# Patient Record
Sex: Male | Born: 1995 | Race: White | Hispanic: No | Marital: Single | Smoking: Never smoker
Health system: Southern US, Community
[De-identification: ages and names within clinical notes are randomized; demographics above are authoritative.]

---
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
## 2016-04-22 ENCOUNTER — Emergency Department (HOSPITAL_COMMUNITY)
Admission: EM | Admit: 2016-04-22 | Discharge: 2016-04-22 | Disposition: A | Discharge status: Other Acute Inpatient Hospital | Payer: BLUE CROSS/BLUE SHIELD | Attending: Emergency Medicine | Admitting: Emergency Medicine

## 2016-04-22 ENCOUNTER — Encounter (HOSPITAL_COMMUNITY): Payer: Self-pay

## 2016-04-22 ENCOUNTER — Emergency Department (HOSPITAL_COMMUNITY): Payer: BLUE CROSS/BLUE SHIELD

## 2016-04-22 DIAGNOSIS — R51 Headache: Secondary | ICD-10-CM | POA: Diagnosis not present

## 2016-04-22 DIAGNOSIS — S0501XA Injury of conjunctiva and corneal abrasion without foreign body, right eye, initial encounter: Secondary | ICD-10-CM | POA: Diagnosis not present

## 2016-04-22 DIAGNOSIS — W228XXA Striking against or struck by other objects, initial encounter: Secondary | ICD-10-CM | POA: Insufficient documentation

## 2016-04-22 DIAGNOSIS — Y9389 Activity, other specified: Secondary | ICD-10-CM | POA: Insufficient documentation

## 2016-04-22 DIAGNOSIS — S0591XA Unspecified injury of right eye and orbit, initial encounter: Secondary | ICD-10-CM

## 2016-04-22 DIAGNOSIS — Y999 Unspecified external cause status: Secondary | ICD-10-CM | POA: Diagnosis not present

## 2016-04-22 DIAGNOSIS — Y929 Unspecified place or not applicable: Secondary | ICD-10-CM | POA: Diagnosis not present

## 2016-04-22 MED ORDER — IBUPROFEN 800 MG PO TABS
800.0000 mg | ORAL_TABLET | Freq: Once | ORAL | Status: AC
Start: 2016-04-22 — End: 2016-04-22
  Administered 2016-04-22: 800 mg via ORAL
  Filled 2016-04-22: qty 1

## 2016-04-22 MED ORDER — FLUORESCEIN SODIUM 1 MG OP STRP
1.0000 | ORAL_STRIP | Freq: Once | OPHTHALMIC | Status: AC
  Administered 2016-04-22: 1 via OPHTHALMIC
  Filled 2016-04-22: qty 1

## 2016-04-22 MED ORDER — ACETAMINOPHEN 500 MG PO TABS
1000.0000 mg | ORAL_TABLET | Freq: Once | ORAL | Status: AC
  Administered 2016-04-22: 1000 mg via ORAL
  Filled 2016-04-22: qty 2

## 2016-04-22 MED ORDER — BACITRACIN ZINC 500 UNIT/GM EX OINT
1.0000 "application " | TOPICAL_OINTMENT | Freq: Once | CUTANEOUS | Status: AC
  Administered 2016-04-22: 1 via TOPICAL
  Filled 2016-04-22: qty 0.9

## 2016-04-22 MED ORDER — PROPARACAINE HCL 0.5 % OP SOLN
1.0000 [drp] | Freq: Once | OPHTHALMIC | Status: AC
  Administered 2016-04-22: 1 [drp] via OPHTHALMIC
  Filled 2016-04-22: qty 15

## 2016-04-22 MED ORDER — POLYMYXIN B-TRIMETHOPRIM 10000-0.1 UNIT/ML-% OP SOLN
1.0000 [drp] | Freq: Once | OPHTHALMIC | Status: AC
  Administered 2016-04-22: 1 [drp] via OPHTHALMIC
  Filled 2016-04-22: qty 10

## 2016-04-22 NOTE — ED Triage Notes (Signed)
Patient here with red eye redness and pain and 2 facial lacerations after taking lacrosse ball to right side of face and right eye at practice, also complains of posterior headache but was not knocked down. Redness noted to sclera and unsure if part of sunglass lenses went into eye. Alert and oriented. No bleeding from lacerations.

## 2016-04-22 NOTE — ED Notes (Signed)
Patient returned from ct and placed on monitor 

## 2016-04-22 NOTE — Discharge Instructions (Signed)
[facility], ophthalmologist will evaluate you there.

## 2016-04-22 NOTE — ED Notes (Signed)
Waiting for ophthalmology, family updated

## 2016-04-22 NOTE — Consult Note (Signed)
Ophthalmology Consult  This is a 20 yo gentleman who was hit in the right eye with a lacrosse ball this morning.  Pt was wearing sunglasses which broke when hit.  Pt has pain in the eye and the vision is blurry.  Eye is red but patient can keep it open.    Pt has no PMHx, PSHX, or past family history.  Pt is on no home medicatiuons prior to today.    On exam pt was found to be 20/30 in the right eye and 20/20 in the left eye.  The right pupil was sluggish but responsive.  Extraocular motility was intact.  Intraocular pressure was 15 in the right eye and 14 in the left.  On slit lamp exam pt was found to have abrasions of the upper and lower eyelids on the right side but no lacerations.  Pt was found to have a conjunctival laceration on the temporal aspect of the right eye.  The laceration was 8 mm in the vertical aspect and 2 mm laterally.  There was intact tenons beneath the conjunctival laceration and no hemorrhage under the laceration.  There was one area of about .5 mm where the tenons was involved but the underlying sclera was not involved.  The cornea was clear with no abrasions or lacerations.  There was 2+ cell/flare in the right eye and the pupil was mildly irregular b ut no peaking of pupil and no iris tears seen.  There was no hyphema present.  The slitlamp exam in the left eye was within normal limits.  On dilated exam patient was found to have a c/d ratio of 0.4 and there was no vitreous hemorrhage.  The patient did have commotio retinae that was found to be in the temporal, inferior, and nasal retina.  There was intraretinal hemorrhage inferiorly and nasally with questionable tear inferiorly.  The undilated exam in the left eye was normal.    A/P 1.  Trauma to the right eye with superficial conjunctival laceration, traumatic iritis, commotio retinae, and intraretinal hemorrhage with questionable tear inferiorly.  Due to the intraretinal heme I could not rule out inferior  tear so will send to  [facility].  The conjunctival laceration was superficial with intact tenons except for 1 small area.  This should heal without need for surgery.  No open globe seen based on no scleral laceration and no vitreous hemorrhage or hyphema.  No peaking of pupil.  The pt does need to use antibiotic topically due to laceration.  Pt also with traumatic iritis.  Pt will need to start steroid drop and cyclogyl for comfort care.  These [facility].  The [facility] is going directly [facility].  I can follow up with the patient [facility] if they need.    Thank you for giving me the opportunity to participate in the care of this patient.  Please feel free to contact me if you have any concerns.  Mia Creekimothy Reesha Debes, M.D. (cell) [phone] (office) [phone]

## 2016-04-22 NOTE — ED Provider Notes (Signed)
[facility]-EMERGENCY DEPT Provider Note   CSN: [id number] Arrival date & time: 04/22/16  1012     History   Chief Complaint Chief Complaint  Patient presents with  . Eye Injury  . Facial Injury    HPI  Blood pressure 132/93, pulse 78, temperature 98.5 F (36.9 C), temperature source Oral, resp. rate 16, height 5\' 7"  (1.702 m), weight 78.5 kg, SpO2 99 %.  Lawrence Rubio is a 20 y.o. male complaining of moderate pain to right eye after he was hit with a lacrosse ball while playing earlier in the day. He was wearing sunglasses at the time which shattered. He states he has slightly blurred vision out of the right eye and posterior headache but there was no loss of consciousness, nausea vomiting.  HPI  History reviewed. No pertinent past medical history.  There are no active problems to display for this patient.   History reviewed. No pertinent surgical history.     Home Medications    Prior to Admission medications   Medication Sig Start Date End Date Taking? Authorizing Provider  Multiple Vitamin (MULTIVITAMIN WITH MINERALS) TABS tablet Take 1 tablet by mouth daily.   Yes Historical Provider, MD    Family History No family history on file.  Social History Social History  Substance Use Topics  . Smoking status: Never Smoker  . Smokeless tobacco: Never Used  . Alcohol use Not on file     Allergies   Review of patient's allergies indicates no known allergies.   Review of Systems Review of Systems    Physical Exam Updated Vital Signs BP (!) 122/49   Pulse 71   Temp 98 F (36.7 C)   Resp 18   Ht 5\' 7"  (1.702 m)   Wt 78.5 kg   SpO2 100%   BMI 27.10 kg/m   Physical Exam  Constitutional: He is oriented to person, place, and time. He appears well-developed and well-nourished. No distress.  HENT:  Head: Normocephalic.  Mouth/Throat: Oropharynx is clear and moist.  Partial thickness lacerations as diagrammed  Tenderness to palpation or crepitance along  the orbital rim, extraocular movement is intact without pain or diplopia.  Right pupil is slightly irregular and less actively reactive, no consensual photophobia  Eyes: Conjunctivae and EOM are normal. Pupils are equal, round, and reactive to light.    Corneal abrasion as diagrammed, Seidel sign is negative.  Neck: Normal range of motion.  Cardiovascular: Normal rate, regular rhythm and intact distal pulses.   Pulmonary/Chest: Effort normal and breath sounds normal.  Abdominal: Soft. There is no tenderness.  Musculoskeletal: Normal range of motion.  Neurological: He is alert and oriented to person, place, and time.  Skin: He is not diaphoretic.  Psychiatric: He has a normal mood and affect.  Nursing note and vitals reviewed.    ED Treatments / Results  Labs (all labs ordered are listed, but only abnormal results are displayed) Labs Reviewed - No data to display  EKG  EKG Interpretation None       Radiology Ct Head Wo Contrast  Result Date: 04/22/2016 CLINICAL DATA:  Hit by a lacrosse ball in right eye, facial injury/ laceration, headache EXAM: CT HEAD AND ORBITS WITHOUT CONTRAST TECHNIQUE: Contiguous axial images were obtained from the base of the skull through the vertex without contrast. Multidetector CT imaging of the orbits was performed using the standard protocol without intravenous contrast. COMPARISON:  None. FINDINGS: CT HEAD FINDINGS Brain: No evidence of acute infarction, hemorrhage, hydrocephalus, extra-axial  collection or mass lesion/mass effect. Vascular: No hyperdense vessel or unexpected calcification. Skull: Normal. Negative for fracture or focal lesion. Sinuses/Orbits: No acute finding. Other: Cerebral volume is within normal limits. No ventriculomegaly. CT ORBITS FINDINGS The bilateral globes are intact. Mild preseptal soft tissue swelling overlying the inferior aspect of the right orbit (series 6/image 20). Bilateral retroconal soft tissues are within normal  limits. No evidence of orbital emphysema. No radiopaque foreign body is seen. The visualized paranasal sinuses are essentially clear. The mastoid air cells are unopacified. No evidence of maxillofacial fracture. Bilateral mandibular condyles are well-seated in the TMJs. IMPRESSION: Mild preseptal soft tissue swelling overlying the inferior aspect of the right orbit. Underlying globe and retroconal soft tissues are within normal limits. No evidence of maxillofacial fracture. Normal head CT. Electronically Signed   By: Charline BillsSriyesh  Krishnan M.D.   On: 04/22/2016 13:21   Ct Orbits Wo Contrast  Result Date: 04/22/2016 CLINICAL DATA:  Hit by a lacrosse ball in right eye, facial injury/ laceration, headache EXAM: CT HEAD AND ORBITS WITHOUT CONTRAST TECHNIQUE: Contiguous axial images were obtained from the base of the skull through the vertex without contrast. Multidetector CT imaging of the orbits was performed using the standard protocol without intravenous contrast. COMPARISON:  None. FINDINGS: CT HEAD FINDINGS Brain: No evidence of acute infarction, hemorrhage, hydrocephalus, extra-axial collection or mass lesion/mass effect. Vascular: No hyperdense vessel or unexpected calcification. Skull: Normal. Negative for fracture or focal lesion. Sinuses/Orbits: No acute finding. Other: Cerebral volume is within normal limits. No ventriculomegaly. CT ORBITS FINDINGS The bilateral globes are intact. Mild preseptal soft tissue swelling overlying the inferior aspect of the right orbit (series 6/image 20). Bilateral retroconal soft tissues are within normal limits. No evidence of orbital emphysema. No radiopaque foreign body is seen. The visualized paranasal sinuses are essentially clear. The mastoid air cells are unopacified. No evidence of maxillofacial fracture. Bilateral mandibular condyles are well-seated in the TMJs. IMPRESSION: Mild preseptal soft tissue swelling overlying the inferior aspect of the right orbit. Underlying  globe and retroconal soft tissues are within normal limits. No evidence of maxillofacial fracture. Normal head CT. Electronically Signed   By: Charline BillsSriyesh  Krishnan M.D.   On: 04/22/2016 13:21    Procedures Procedures (including critical care time)  Medications Ordered in ED Medications  proparacaine (ALCAINE) 0.5 % ophthalmic solution 1 drop (1 drop Both Eyes Given 04/22/16 1054)  fluorescein ophthalmic strip 1 strip (1 strip Both Eyes Given 04/22/16 1054)  trimethoprim-polymyxin b (POLYTRIM) ophthalmic solution 1 drop (1 drop Right Eye Given 04/22/16 1141)  acetaminophen (TYLENOL) tablet 1,000 mg (1,000 mg Oral Given 04/22/16 1133)  ibuprofen (ADVIL,MOTRIN) tablet 800 mg (800 mg Oral Given 04/22/16 1133)  bacitracin ointment 1 application (1 application Topical Given 04/22/16 1133)  fluorescein ophthalmic strip 1 strip (1 strip Both Eyes Given 04/22/16 1227)     Initial Impression / Assessment and Plan / ED Course  I have reviewed the triage vital signs and the nursing notes.  Pertinent labs & imaging results that were available during my care of the patient were reviewed by me and considered in my medical decision making (see chart for details).  Clinical Course    Vitals:   04/22/16 1400 04/22/16 1545 04/22/16 1600 04/22/16 1623  BP: 123/73 122/75 (!) 122/49 (!) 122/49  Pulse: 62 72 65 71  Resp:   18 18  Temp:    98 F (36.7 C)  TempSrc:      SpO2: 97% 100% 100% 100%  Weight:  Height:        Medications  proparacaine (ALCAINE) 0.5 % ophthalmic solution 1 drop (1 drop Both Eyes Given 04/22/16 1054)  fluorescein ophthalmic strip 1 strip (1 strip Both Eyes Given 04/22/16 1054)  trimethoprim-polymyxin b (POLYTRIM) ophthalmic solution 1 drop (1 drop Right Eye Given 04/22/16 1141)  acetaminophen (TYLENOL) tablet 1,000 mg (1,000 mg Oral Given 04/22/16 1133)  ibuprofen (ADVIL,MOTRIN) tablet 800 mg (800 mg Oral Given 04/22/16 1133)  bacitracin ointment 1 application (1 application Topical  Given 04/22/16 1133)  fluorescein ophthalmic strip 1 strip (1 strip Both Eyes Given 04/22/16 1227)    Lawrence Rubio is 20 y.o. male presenting with Right-sided facial trauma after he was hit with a lacrosse ball well wearing sunglasses. He has some injection in the right eye, corneal abrasion is seen on fluorescein stain with no Seidel sign. The right pupil is slightly irregular and sluggishly reactive. Concern for globe rupture.  Ophthalmologic consult by Dr. Vonna Kotyk appreciated: He is evaluated the patient and does not believe this is globe rupture. More likely traumatic iritis. States she has a conjunctival laceration, On further evaluation he is worried about a possible partial retinal detachment. He has discussed the case with ophthalmologic resident Dr. Zenaida Niece at [facility] who accepts admission, we have confirmed this via the power line. Patient will travel by private vehicle to the [facility] emergency department. I have called the power line to ask if we should recheck out to the ED staff to give report however Angelique at the power line states that that is not necessary.    Final Clinical Impressions(s) / ED Diagnoses   Final diagnoses:  Right eye injury, initial encounter    New Prescriptions New Prescriptions   No medications on file     Wynetta Emery, PA-C 04/22/16 1628    Glynn Octave, MD 04/22/16 1843

## 2017-06-11 IMAGING — CT CT ORBITS W/O CM
3 of 6 series · 15 of 47 positions shown, 18 images · non-contrast
Comparison: None.

CLINICAL DATA: Hit by a lacrosse ball in right eye, facial injury/
laceration, headache

EXAM:
CT HEAD AND ORBITS WITHOUT CONTRAST
TECHNIQUE: Contiguous axial images were obtained from the base of the skull
through the vertex without contrast. Multidetector CT imaging of the
orbits was performed using the standard protocol without intravenous
contrast.

[Series 2: head 5.0 h30s · axial · 0.43mm/px · z∈[-83,+42]mm · 11 of 31 slices shown, 14 images]
[im 3/31  brain]
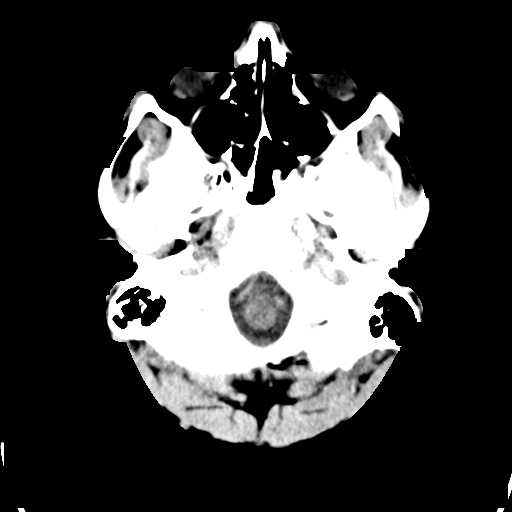
[im 3/31  bone]
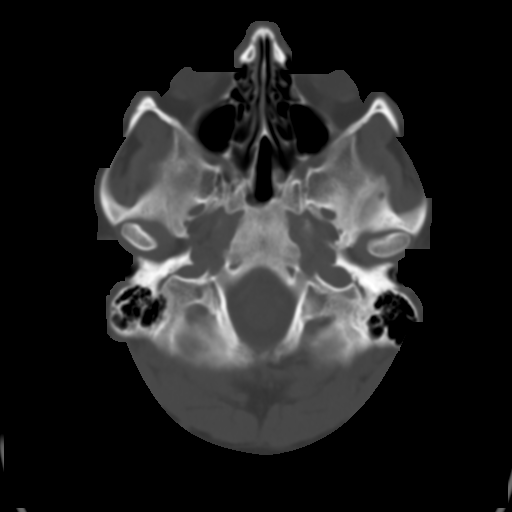
[im 6/31  bone]
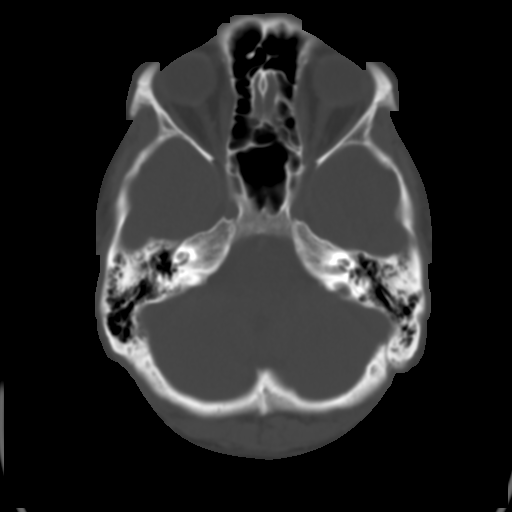
[im 8/31  bone]
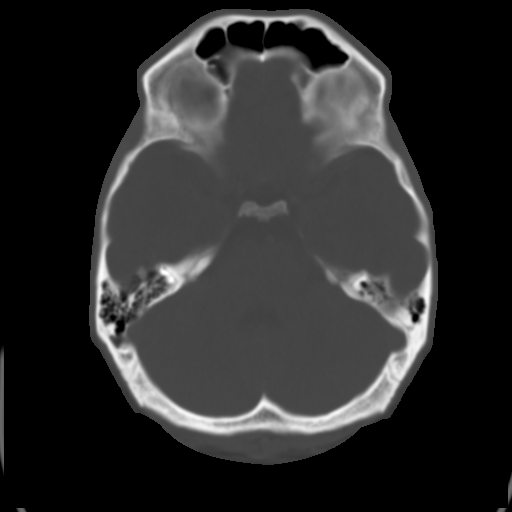
[im 11/31  bone]
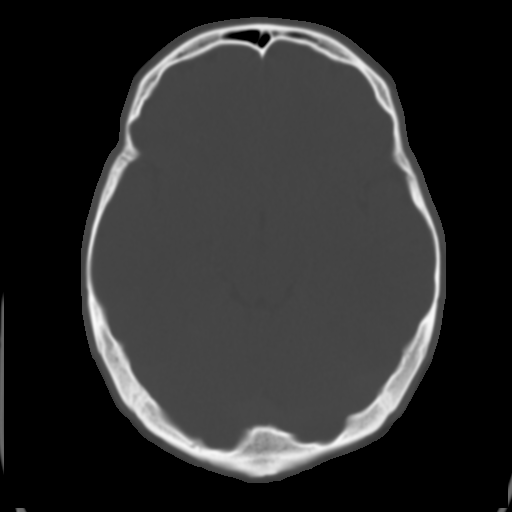
[im 13/31  brain]
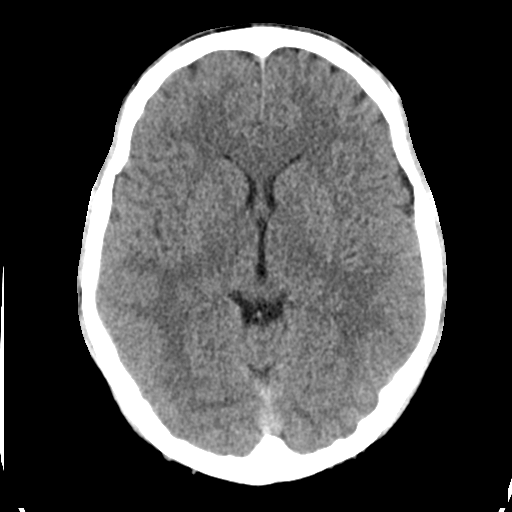
[im 13/31  bone]
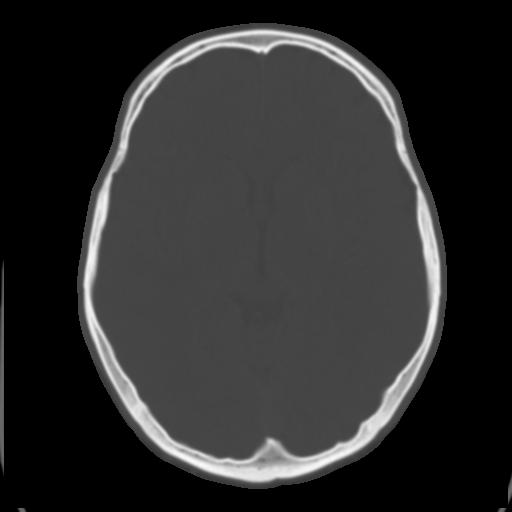
[im 16/31  bone]
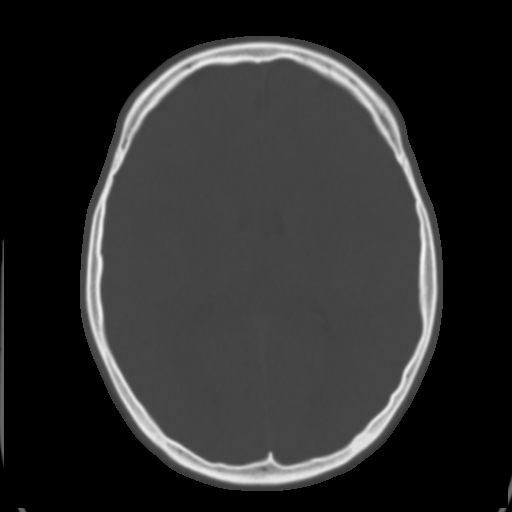
[im 18/31  bone]
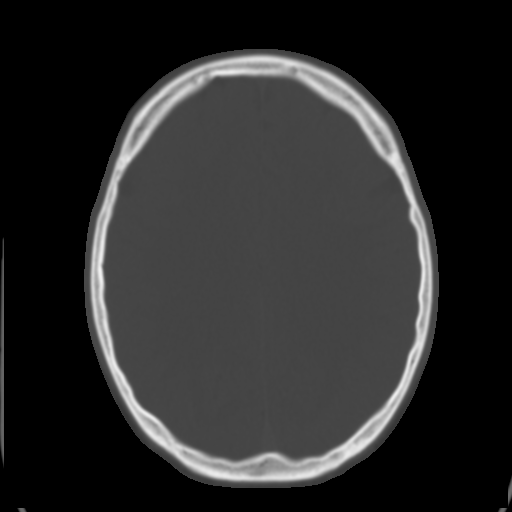
[im 21/31  bone]
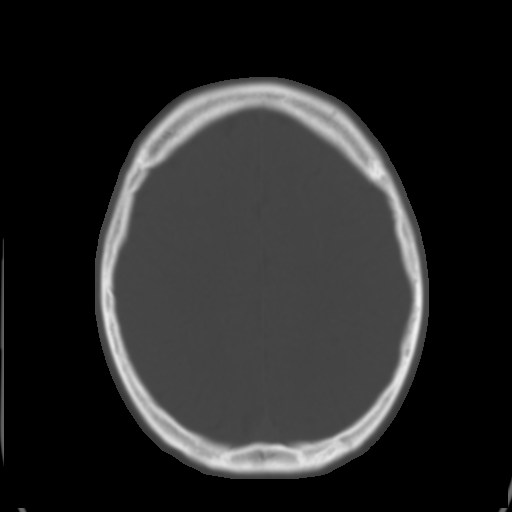
[im 23/31  brain]
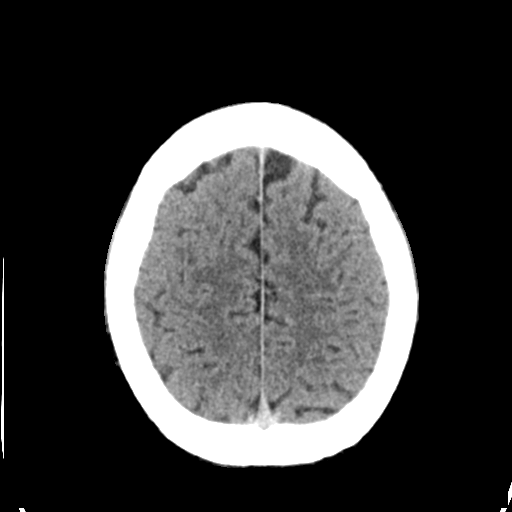
[im 23/31  bone]
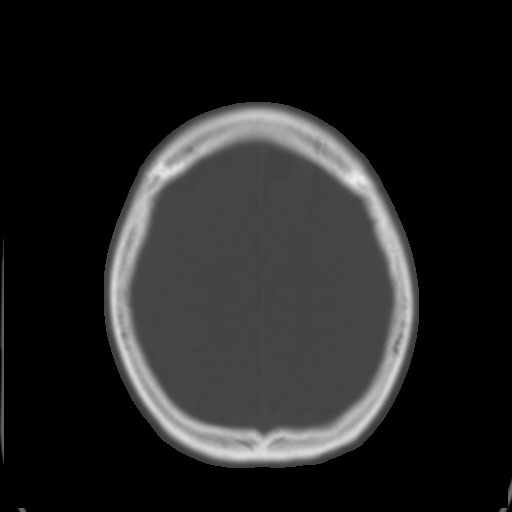
[im 26/31  bone]
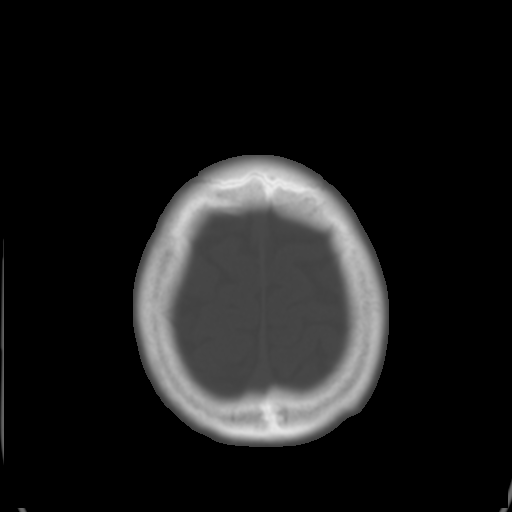
[im 28/31  bone]
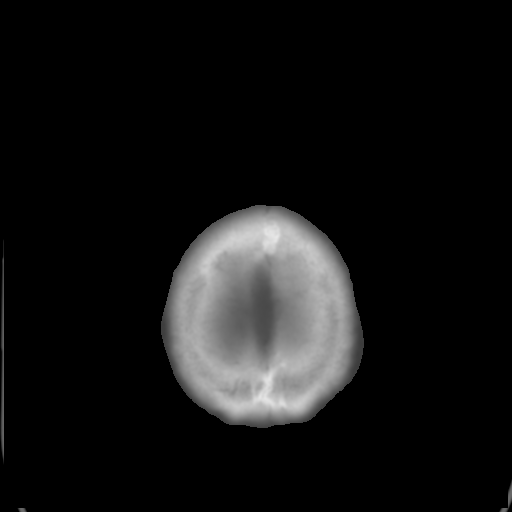

[Series 4: head 3.0 mpr · coronal · 0.32mm/px · 3 of 72 slices shown (1 of 2)]
[im 12/72  bone]
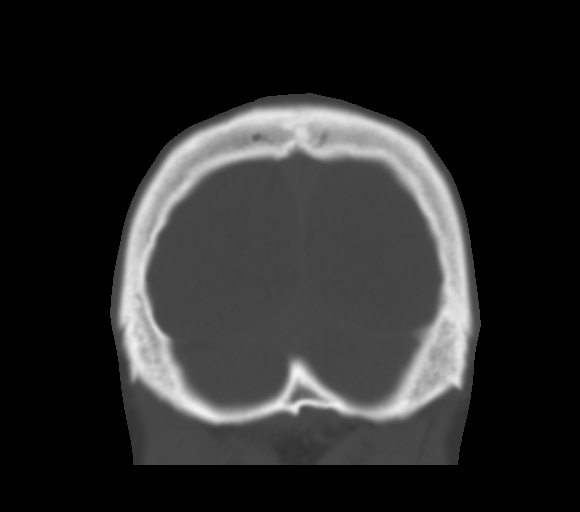
[im 16/72  bone]
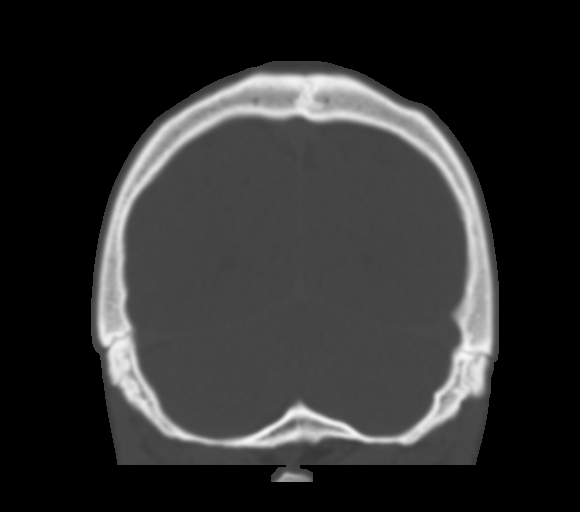
[im 20/72  bone]
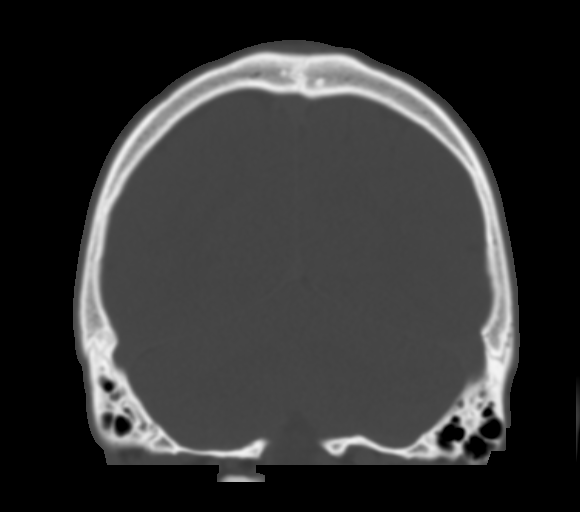

[Series 5: head 3.0 mpr · sagittal · 0.32mm/px · 1 of 57 slices shown (2 of 2)]
[im 29/57  bone]
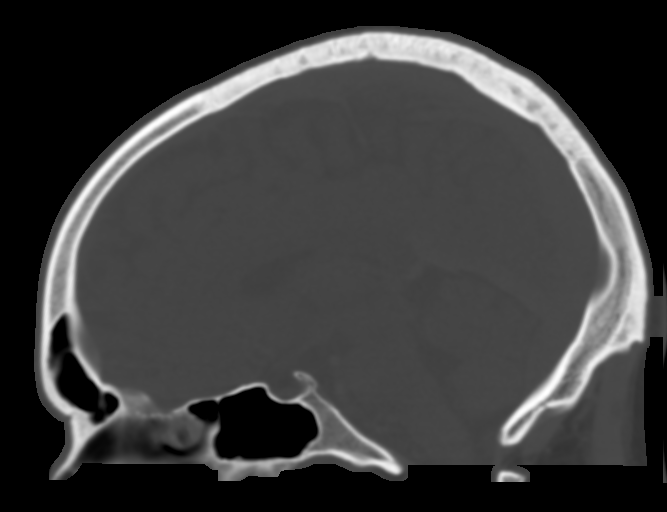

[15 of 47 positions shown; findings below may reference images not displayed]

FINDINGS: CT HEAD FINDINGS

Brain: No evidence of acute infarction, hemorrhage, hydrocephalus,
extra-axial collection or mass lesion/mass effect.

Vascular: No hyperdense vessel or unexpected calcification.

Skull: Normal. Negative for fracture or focal lesion.

Sinuses/Orbits: No acute finding.

Other: Cerebral volume is within normal limits. No ventriculomegaly.

CT ORBITS FINDINGS

The bilateral globes are intact. Mild preseptal soft tissue swelling
overlying the inferior aspect of the right orbit (series 6/image
20). Bilateral retroconal soft tissues are within normal limits. No
evidence of orbital emphysema. No radiopaque foreign body is seen.

The visualized paranasal sinuses are essentially clear. The mastoid
air cells are unopacified.

No evidence of maxillofacial fracture.

Bilateral mandibular condyles are well-seated in the TMJs.
IMPRESSION: Mild preseptal soft tissue swelling overlying the inferior aspect of
the right orbit. Underlying globe and retroconal soft tissues are
within normal limits.

No evidence of maxillofacial fracture.

Normal head CT.

## 2019-05-24 ENCOUNTER — Encounter (INDEPENDENT_AMBULATORY_CARE_PROVIDER_SITE_OTHER): Payer: Self-pay

## 2019-08-22 ENCOUNTER — Ambulatory Visit: Payer: PRIVATE HEALTH INSURANCE | Attending: Internal Medicine

## 2019-08-22 DIAGNOSIS — Z20822 Contact with and (suspected) exposure to covid-19: Secondary | ICD-10-CM

## 2019-08-23 LAB — NOVEL CORONAVIRUS, NAA: SARS-CoV-2, NAA: NOT DETECTED

## 2019-08-27 ENCOUNTER — Other Ambulatory Visit: Payer: BLUE CROSS/BLUE SHIELD
